# Patient Record
Sex: Female | Born: 1995 | Race: White | Hispanic: No | Marital: Single | Smoking: Never smoker
Health system: Southern US, Community
[De-identification: ages and names within clinical notes are randomized; demographics above are authoritative.]

## PROBLEM LIST (undated history)

## (undated) DIAGNOSIS — F909 Attention-deficit hyperactivity disorder, unspecified type: Secondary | ICD-10-CM

## (undated) HISTORY — DX: Attention-deficit hyperactivity disorder, unspecified type: F90.9

## (undated) HISTORY — PX: WISDOM TOOTH EXTRACTION: SHX21

---
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
## 2016-02-04 DIAGNOSIS — F329 Major depressive disorder, single episode, unspecified: Secondary | ICD-10-CM | POA: Diagnosis not present

## 2016-03-24 DIAGNOSIS — F329 Major depressive disorder, single episode, unspecified: Secondary | ICD-10-CM | POA: Diagnosis not present

## 2016-05-17 DIAGNOSIS — L568 Other specified acute skin changes due to ultraviolet radiation: Secondary | ICD-10-CM | POA: Diagnosis not present

## 2016-05-17 DIAGNOSIS — B078 Other viral warts: Secondary | ICD-10-CM | POA: Diagnosis not present

## 2016-05-17 DIAGNOSIS — D225 Melanocytic nevi of trunk: Secondary | ICD-10-CM | POA: Diagnosis not present

## 2016-05-17 DIAGNOSIS — L2084 Intrinsic (allergic) eczema: Secondary | ICD-10-CM | POA: Diagnosis not present

## 2016-05-24 DIAGNOSIS — F329 Major depressive disorder, single episode, unspecified: Secondary | ICD-10-CM | POA: Diagnosis not present

## 2016-08-04 DIAGNOSIS — Z6822 Body mass index (BMI) 22.0-22.9, adult: Secondary | ICD-10-CM | POA: Diagnosis not present

## 2016-08-04 DIAGNOSIS — F329 Major depressive disorder, single episode, unspecified: Secondary | ICD-10-CM | POA: Diagnosis not present

## 2016-08-04 DIAGNOSIS — Z01419 Encounter for gynecological examination (general) (routine) without abnormal findings: Secondary | ICD-10-CM | POA: Diagnosis not present

## 2016-10-03 DIAGNOSIS — F430 Acute stress reaction: Secondary | ICD-10-CM | POA: Diagnosis not present

## 2016-11-20 DIAGNOSIS — J029 Acute pharyngitis, unspecified: Secondary | ICD-10-CM | POA: Diagnosis not present

## 2016-11-29 DIAGNOSIS — J029 Acute pharyngitis, unspecified: Secondary | ICD-10-CM | POA: Diagnosis not present

## 2017-05-05 DIAGNOSIS — F329 Major depressive disorder, single episode, unspecified: Secondary | ICD-10-CM | POA: Diagnosis not present

## 2017-05-16 DIAGNOSIS — Z79899 Other long term (current) drug therapy: Secondary | ICD-10-CM | POA: Diagnosis not present

## 2017-05-24 DIAGNOSIS — B373 Candidiasis of vulva and vagina: Secondary | ICD-10-CM | POA: Diagnosis not present

## 2017-05-24 DIAGNOSIS — Z1159 Encounter for screening for other viral diseases: Secondary | ICD-10-CM | POA: Diagnosis not present

## 2017-05-24 DIAGNOSIS — Z114 Encounter for screening for human immunodeficiency virus [HIV]: Secondary | ICD-10-CM | POA: Diagnosis not present

## 2017-05-24 DIAGNOSIS — Z113 Encounter for screening for infections with a predominantly sexual mode of transmission: Secondary | ICD-10-CM | POA: Diagnosis not present

## 2017-05-24 DIAGNOSIS — Z118 Encounter for screening for other infectious and parasitic diseases: Secondary | ICD-10-CM | POA: Diagnosis not present

## 2017-06-04 DIAGNOSIS — F909 Attention-deficit hyperactivity disorder, unspecified type: Secondary | ICD-10-CM | POA: Diagnosis not present

## 2017-06-04 DIAGNOSIS — F329 Major depressive disorder, single episode, unspecified: Secondary | ICD-10-CM | POA: Diagnosis not present

## 2017-06-18 DIAGNOSIS — F909 Attention-deficit hyperactivity disorder, unspecified type: Secondary | ICD-10-CM | POA: Diagnosis not present

## 2017-06-18 DIAGNOSIS — F329 Major depressive disorder, single episode, unspecified: Secondary | ICD-10-CM | POA: Diagnosis not present

## 2017-07-20 DIAGNOSIS — F900 Attention-deficit hyperactivity disorder, predominantly inattentive type: Secondary | ICD-10-CM | POA: Diagnosis not present

## 2017-08-13 ENCOUNTER — Encounter: Payer: Self-pay | Admitting: Obstetrics & Gynecology

## 2017-08-13 ENCOUNTER — Ambulatory Visit (INDEPENDENT_AMBULATORY_CARE_PROVIDER_SITE_OTHER): Payer: BLUE CROSS/BLUE SHIELD | Admitting: Obstetrics & Gynecology

## 2017-08-13 VITALS — BP 112/70 | Ht 66.0 in | Wt 134.0 lb

## 2017-08-13 DIAGNOSIS — Z3041 Encounter for surveillance of contraceptive pills: Secondary | ICD-10-CM

## 2017-08-13 DIAGNOSIS — Z113 Encounter for screening for infections with a predominantly sexual mode of transmission: Secondary | ICD-10-CM

## 2017-08-13 DIAGNOSIS — Z01419 Encounter for gynecological examination (general) (routine) without abnormal findings: Secondary | ICD-10-CM

## 2017-08-13 MED ORDER — NORETHIN ACE-ETH ESTRAD-FE 1-20 MG-MCG PO TABS: 1.0000 | ORAL_TABLET | Freq: Every day | ORAL | 4 refills | Status: DC

## 2017-08-13 NOTE — Progress Notes (Signed)
Stephanie Hardin 09/05/1995 [id number]   History:    22 y.o. G0  Single.  [employer] in [employer].  Working in a store.  RP:  New patient presenting for annual gyn exam   HPI: Well on Junel Fe 1/20, good compliance, normal withdrawal menses.  No pelvic pain.  No pain with IC.  Not always using condoms.  Urine/BMs wnl.  Breasts wnl.  BMI 21.63.  Physically active, but not in any sport/Gym.  Maternal GM with Ovarian Serous Endometrioid CystadenoCa stage 1 at age 8961.  No other fam h/o Cancer.  Past medical history,surgical history, family history and social history were all reviewed and documented in the EPIC chart.  Gynecologic History No LMP recorded. (Menstrual status: Oral contraceptives). Contraception: OCP (estrogen/progesterone) Last Pap: Never.  Will start next year at [age] yo Last mammogram: Never Bone Density: N/A Colonoscopy: N/A  Obstetric History OB History  Gravida Para Term Preterm AB Living  0 0 0 0 0 0  SAB TAB Ectopic Multiple Live Births  0 0 0 0 0     ROS: A ROS was performed and pertinent positives and negatives are included in the history.  GENERAL: No fevers or chills. HEENT: No change in vision, no earache, sore throat or sinus congestion. NECK: No pain or stiffness. CARDIOVASCULAR: No chest pain or pressure. No palpitations. PULMONARY: No shortness of breath, cough or wheeze. GASTROINTESTINAL: No abdominal pain, nausea, vomiting or diarrhea, melena or bright red blood per rectum. GENITOURINARY: No urinary frequency, urgency, hesitancy or dysuria. MUSCULOSKELETAL: No joint or muscle pain, no back pain, no recent trauma. DERMATOLOGIC: No rash, no itching, no lesions. ENDOCRINE: No polyuria, polydipsia, no heat or cold intolerance. No recent change in weight. HEMATOLOGICAL: No anemia or easy bruising or bleeding. NEUROLOGIC: No headache, seizures, numbness, tingling or weakness. PSYCHIATRIC: No depression, no loss of interest in normal activity or change in sleep  pattern.     Exam:   BP 112/70   Ht 5\' 6"  (1.676 m)   Wt 134 lb (60.8 kg)   BMI 21.63 kg/m   Body mass index is 21.63 kg/m.  General appearance : Well developed well nourished female. No acute distress HEENT: Eyes: no retinal hemorrhage or exudates,  Neck supple, trachea midline, no carotid bruits, no thyroidmegaly Lungs: Clear to auscultation, no rhonchi or wheezes, or rib retractions  Heart: Regular rate and rhythm, no murmurs or gallops Breast:Examined in sitting and supine position were symmetrical in appearance, no palpable masses or tenderness,  no skin retraction, no nipple inversion, no nipple discharge, no skin discoloration, no axillary or supraclavicular lymphadenopathy Abdomen: no palpable masses or tenderness, no rebound or guarding Extremities: no edema or skin discoloration or tenderness  Pelvic: Vulva: Normal             Vagina: No gross lesions or discharge  Cervix: No gross lesions or discharge.  Gono-Chlam done  Uterus  AV, normal size, shape and consistency, non-tender and mobile  Adnexa  Without masses or tenderness  Anus: Normal   Assessment/Plan:  22 y.o. female for annual exam   1. Well female exam with routine gynecological exam Normal gynecologic exam.  Will start Pap test next year at 22.  Breast exam normal.  2. Encounter for surveillance of contraceptive pills Well on Junel FE 1/20.  No contraindication.  Good compliance.  Same birth control prescribed.  3. Screen for STD (sexually transmitted disease) Strict condom use recommended. - Gono-Chlam - HIV antibody (with reflex) -  RPR - Hepatitis B Surface AntiGEN - Hepatitis C Antibody  Other order - norethindrone-ethinyl estradiol (JUNEL FE,GILDESS FE,LOESTRIN FE) 1-20 MG-MCG tablet; Take 1 tablet by mouth daily.  Genia Del MD, 9:13 AM 08/13/2017

## 2017-08-14 LAB — C. TRACHOMATIS/N. GONORRHOEAE RNA
C. trachomatis RNA, TMA: NOT DETECTED
N. gonorrhoeae RNA, TMA: NOT DETECTED

## 2017-08-14 LAB — HEPATITIS C ANTIBODY
Hepatitis C Ab: NONREACTIVE
SIGNAL TO CUT-OFF: 0.01 (ref <1.00)

## 2017-08-14 LAB — HIV ANTIBODY (ROUTINE TESTING W REFLEX): HIV 1&2 Ab, 4th Generation: NONREACTIVE

## 2017-08-14 LAB — HEPATITIS B SURFACE ANTIGEN: Hepatitis B Surface Ag: NONREACTIVE

## 2017-08-14 LAB — RPR: RPR: NONREACTIVE

## 2017-08-17 ENCOUNTER — Encounter: Payer: Self-pay | Admitting: Obstetrics & Gynecology

## 2017-08-17 NOTE — Patient Instructions (Signed)
1. Well female exam with routine gynecological exam Normal gynecologic exam.  Will start Pap test next year at 22.  Breast exam normal.  2. Encounter for surveillance of contraceptive pills Well on Junel FE 1/20.  No contraindication.  Good compliance.  Same birth control prescribed.  3. Screen for STD (sexually transmitted disease) Strict condom use recommended. - Gono-Chlam - HIV antibody (with reflex) - RPR - Hepatitis B Surface AntiGEN - Hepatitis C Antibody  Other order - norethindrone-ethinyl estradiol (JUNEL FE,GILDESS FE,LOESTRIN FE) 1-20 MG-MCG tablet; Take 1 tablet by mouth daily.  Airika, it was a pleasure meeting you today!  I will inform you of your results as soon as they are available.  Health Maintenance, Female Adopting a healthy lifestyle and getting preventive care can go a long way to promote health and wellness. Talk with your health care provider about what schedule of regular examinations is right for you. This is a good chance for you to check in with your provider about disease prevention and staying healthy. In between checkups, there are plenty of things you can do on your own. Experts have done a lot of research about which lifestyle changes and preventive measures are most likely to keep you healthy. Ask your health care provider for more information. Weight and diet Eat a healthy diet  Be sure to include plenty of vegetables, fruits, low-fat dairy products, and lean protein.  Do not eat a lot of foods high in solid fats, added sugars, or salt.  Get regular exercise. This is one of the most important things you can do for your health. ? Most adults should exercise for at least 150 minutes each week. The exercise should increase your heart rate and make you sweat (moderate-intensity exercise). ? Most adults should also do strengthening exercises at least twice a week. This is in addition to the moderate-intensity exercise.  Maintain a healthy weight  Body  mass index (BMI) is a measurement that can be used to identify possible weight problems. It estimates body fat based on height and weight. Your health care provider can help determine your BMI and help you achieve or maintain a healthy weight.  For females 21 years of age and older: ? A BMI below 18.5 is considered underweight. ? A BMI of 18.5 to 24.9 is normal. ? A BMI of 25 to 29.9 is considered overweight. ? A BMI of 30 and above is considered obese.  Watch levels of cholesterol and blood lipids  You should start having your blood tested for lipids and cholesterol at 21 years of age, then have this test every 5 years.  You may need to have your cholesterol levels checked more often if: ? Your lipid or cholesterol levels are high. ? You are older than 21 years of age. ? You are at high risk for heart disease.  Cancer screening Lung Cancer  Lung cancer screening is recommended for adults 21-21 years old old who are at high risk for lung cancer because of a history of smoking.  A yearly low-dose CT scan of the lungs is recommended for people who: ? Currently smoke. ? Have quit within the past 15 years. ? Have at least a 30-pack-year history of smoking. A pack year is smoking an average of one pack of cigarettes a day for 1 year.  Yearly screening should continue until it has been 15 years since you quit.  Yearly screening should stop if you develop a health problem that would prevent you from  having lung cancer treatment.  Breast Cancer  Practice breast self-awareness. This means understanding how your breasts normally appear and feel.  It also means doing regular breast self-exams. Let your health care provider know about any changes, no matter how small.  If you are in your 20s or 30s, you should have a clinical breast exam (CBE) by a health care provider every 1-3 years as part of a regular health exam.  If you are 79 or older, have a CBE every year. Also consider having a  breast X-ray (mammogram) every year.  If you have a family history of breast cancer, talk to your health care provider about genetic screening.  If you are at high risk for breast cancer, talk to your health care provider about having an MRI and a mammogram every year.  Breast cancer gene (BRCA) assessment is recommended for women who have family members with BRCA-related cancers. BRCA-related cancers include: ? Breast. ? Ovarian. ? Tubal. ? Peritoneal cancers.  Results of the assessment will determine the need for genetic counseling and BRCA1 and BRCA2 testing.  Cervical Cancer Your health care provider may recommend that you be screened regularly for cancer of the pelvic organs (ovaries, uterus, and vagina). This screening involves a pelvic examination, including checking for microscopic changes to the surface of your cervix (Pap test). You may be encouraged to have this screening done every 3 years, beginning at age 66.  For women ages 31-65, health care providers may recommend pelvic exams and Pap testing every 3 years, or they may recommend the Pap and pelvic exam, combined with testing for human papilloma virus (HPV), every 5 years. Some types of HPV increase your risk of cervical cancer. Testing for HPV may also be done on women of any age with unclear Pap test results.  Other health care providers may not recommend any screening for nonpregnant women who are considered low risk for pelvic cancer and who do not have symptoms. Ask your health care provider if a screening pelvic exam is right for you.  If you have had past treatment for cervical cancer or a condition that could lead to cancer, you need Pap tests and screening for cancer for at least 20 years after your treatment. If Pap tests have been discontinued, your risk factors (such as having a new sexual partner) need to be reassessed to determine if screening should resume. Some women have medical problems that increase the chance  of getting cervical cancer. In these cases, your health care provider may recommend more frequent screening and Pap tests.  Colorectal Cancer  This type of cancer can be detected and often prevented.  Routine colorectal cancer screening usually begins at 21 years of age and continues through 21 years of age.  Your health care provider may recommend screening at an earlier age if you have risk factors for colon cancer.  Your health care provider may also recommend using home test kits to check for hidden blood in the stool.  A small camera at the end of a tube can be used to examine your colon directly (sigmoidoscopy or colonoscopy). This is done to check for the earliest forms of colorectal cancer.  Routine screening usually begins at age 78.  Direct examination of the colon should be repeated every 5-10 years through 21 years of age. However, you may need to be screened more often if early forms of precancerous polyps or small growths are found.  Skin Cancer  Check your skin from head  toe regularly.  Tell your health care provider about any new moles or changes in moles, especially if there is a change in a mole's shape or color.  Also tell your health care provider if you have a mole that is larger than the size of a pencil eraser.  Always use sunscreen. Apply sunscreen liberally and repeatedly throughout the day.  Protect yourself by wearing long sleeves, pants, a wide-brimmed hat, and sunglasses whenever you are outside.  Heart disease, diabetes, and high blood pressure  High blood pressure causes heart disease and increases the risk of stroke. High blood pressure is more likely to develop in: ? People who have blood pressure in the high end of the normal range (130-139/85-89 mm Hg). ? People who are overweight or obese. ? People who are African American.  If you are 18-39 years of age, have your blood pressure checked every 3-5 years. If you are 40 years of age or older,  have your blood pressure checked every year. You should have your blood pressure measured twice-once when you are at a hospital or clinic, and once when you are not at a hospital or clinic. Record the average of the two measurements. To check your blood pressure when you are not at a hospital or clinic, you can use: ? An automated blood pressure machine at a pharmacy. ? A home blood pressure monitor.  If you are between 55 years and 79 years old, ask your health care provider if you should take aspirin to prevent strokes.  Have regular diabetes screenings. This involves taking a blood sample to check your fasting blood sugar level. ? If you are at a normal weight and have a low risk for diabetes, have this test once every three years after 21 years of age. ? If you are overweight and have a high risk for diabetes, consider being tested at a younger age or more often. Preventing infection Hepatitis B  If you have a higher risk for hepatitis B, you should be screened for this virus. You are considered at high risk for hepatitis B if: ? You were born in a country where hepatitis B is common. Ask your health care provider which countries are considered high risk. ? Your parents were born in a high-risk country, and you have not been immunized against hepatitis B (hepatitis B vaccine). ? You have HIV or AIDS. ? You use needles to inject street drugs. ? You live with someone who has hepatitis B. ? You have had sex with someone who has hepatitis B. ? You get hemodialysis treatment. ? You take certain medicines for conditions, including cancer, organ transplantation, and autoimmune conditions.  Hepatitis C  Blood testing is recommended for: ? Everyone born from 1945 through 1965. ? Anyone with known risk factors for hepatitis C.  Sexually transmitted infections (STIs)  You should be screened for sexually transmitted infections (STIs) including gonorrhea and chlamydia if: ? You are sexually  active and are younger than 21 years of age. ? You are older than 21 years of age and your health care provider tells you that you are at risk for this type of infection. ? Your sexual activity has changed since you were last screened and you are at an increased risk for chlamydia or gonorrhea. Ask your health care provider if you are at risk.  If you do not have HIV, but are at risk, it may be recommended that you take a prescription medicine daily to prevent HIV infection.   infection. This is called pre-exposure prophylaxis (PrEP). You are considered at risk if: ? You are sexually active and do not regularly use condoms or know the HIV status of your partner(s). ? You take drugs by injection. ? You are sexually active with a partner who has HIV.  Talk with your health care provider about whether you are at high risk of being infected with HIV. If you choose to begin PrEP, you should first be tested for HIV. You should then be tested every 3 months for as long as you are taking PrEP. Pregnancy  If you are premenopausal and you may become pregnant, ask your health care provider about preconception counseling.  If you may become pregnant, take 400 to 800 micrograms (mcg) of folic acid every day.  If you want to prevent pregnancy, talk to your health care provider about birth control (contraception). Osteoporosis and menopause  Osteoporosis is a disease in which the bones lose minerals and strength with aging. This can result in serious bone fractures. Your risk for osteoporosis can be identified using a bone density scan.  If you are 56 years of age or older, or if you are at risk for osteoporosis and fractures, ask your health care provider if you should be screened.  Ask your health care provider whether you should take a calcium or vitamin D supplement to lower your risk for osteoporosis.  Menopause may have certain physical symptoms and risks.  Hormone replacement therapy may reduce some of these  symptoms and risks. Talk to your health care provider about whether hormone replacement therapy is right for you. Follow these instructions at home:  Schedule regular health, dental, and eye exams.  Stay current with your immunizations.  Do not use any tobacco products including cigarettes, chewing tobacco, or electronic cigarettes.  If you are pregnant, do not drink alcohol.  If you are breastfeeding, limit how much and how often you drink alcohol.  Limit alcohol intake to no more than 1 drink per day for nonpregnant women. One drink equals 12 ounces of beer, 5 ounces of wine, or 1 ounces of hard liquor.  Do not use street drugs.  Do not share needles.  Ask your health care provider for help if you need support or information about quitting drugs.  Tell your health care provider if you often feel depressed.  Tell your health care provider if you have ever been abused or do not feel safe at home. This information is not intended to replace advice given to you by your health care provider. Make sure you discuss any questions you have with your health care provider. Document Released: 11/21/2010 Document Revised: 10/14/2015 Document Reviewed: 02/09/2015 Elsevier Interactive Patient Education  [employer].

## 2017-09-05 DIAGNOSIS — F900 Attention-deficit hyperactivity disorder, predominantly inattentive type: Secondary | ICD-10-CM | POA: Diagnosis not present

## 2017-12-04 DIAGNOSIS — F900 Attention-deficit hyperactivity disorder, predominantly inattentive type: Secondary | ICD-10-CM | POA: Diagnosis not present

## 2017-12-24 ENCOUNTER — Telehealth: Payer: Self-pay | Admitting: *Deleted

## 2017-12-24 MED ORDER — NORETHIN ACE-ETH ESTRAD-FE 1-20 MG-MCG PO TABS: ORAL_TABLET | ORAL | 4 refills | Status: DC

## 2017-12-24 NOTE — Telephone Encounter (Signed)
Pharmacy called and said patient takes active pills daily skipping placebo pills needs # 84 Rx sent to pharmacy stating this, Rx re-sent to pharmacy with directions as note above. Patient mother aware this has been done.

## 2018-03-19 DIAGNOSIS — F329 Major depressive disorder, single episode, unspecified: Secondary | ICD-10-CM | POA: Diagnosis not present

## 2018-03-19 DIAGNOSIS — F900 Attention-deficit hyperactivity disorder, predominantly inattentive type: Secondary | ICD-10-CM | POA: Diagnosis not present

## 2018-05-27 DIAGNOSIS — J1089 Influenza due to other identified influenza virus with other manifestations: Secondary | ICD-10-CM | POA: Diagnosis not present

## 2018-08-24 ENCOUNTER — Other Ambulatory Visit: Payer: Self-pay | Admitting: Obstetrics & Gynecology

## 2018-09-16 DIAGNOSIS — F900 Attention-deficit hyperactivity disorder, predominantly inattentive type: Secondary | ICD-10-CM | POA: Diagnosis not present

## 2018-09-19 ENCOUNTER — Encounter: Payer: BLUE CROSS/BLUE SHIELD | Admitting: Obstetrics & Gynecology

## 2018-11-15 ENCOUNTER — Other Ambulatory Visit: Payer: Self-pay | Admitting: Obstetrics & Gynecology

## 2018-11-15 NOTE — Telephone Encounter (Signed)
Annual exam scheduled on 11/28/18

## 2018-11-28 ENCOUNTER — Encounter: Payer: BLUE CROSS/BLUE SHIELD | Admitting: Obstetrics & Gynecology

## 2018-12-03 ENCOUNTER — Ambulatory Visit (INDEPENDENT_AMBULATORY_CARE_PROVIDER_SITE_OTHER): Payer: BC Managed Care – PPO | Admitting: Obstetrics & Gynecology

## 2018-12-03 ENCOUNTER — Encounter: Payer: Self-pay | Admitting: Obstetrics & Gynecology

## 2018-12-03 ENCOUNTER — Other Ambulatory Visit: Payer: Self-pay

## 2018-12-03 VITALS — BP 110/70 | Ht 66.0 in | Wt 129.0 lb

## 2018-12-03 DIAGNOSIS — Z113 Encounter for screening for infections with a predominantly sexual mode of transmission: Secondary | ICD-10-CM | POA: Diagnosis not present

## 2018-12-03 DIAGNOSIS — Z01419 Encounter for gynecological examination (general) (routine) without abnormal findings: Secondary | ICD-10-CM

## 2018-12-03 DIAGNOSIS — Z3041 Encounter for surveillance of contraceptive pills: Secondary | ICD-10-CM

## 2018-12-03 MED ORDER — NORETHIN ACE-ETH ESTRAD-FE 1-20 MG-MCG PO TABS: ORAL_TABLET | ORAL | 4 refills | Status: DC

## 2018-12-03 NOTE — Progress Notes (Signed)
Stephanie Hardin March 13, 1996 [id number]   History:    23 y.o. G0 Single. New boyfriend x last year.  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  RP:  Established patient presenting for annual gyn exam   HPI: Well on Microgestin 1/20 Fe continuous use.  No BTB.  No pelvic pain.  No pain with IC.  Not using condoms.  Urine/BMs normal.  Breasts normal.  BMI 20.82.  Walking occasionally.  Past medical history,surgical history, family history and social history were all reviewed and documented in the EPIC chart.  Gynecologic History No LMP recorded. (Menstrual status: Oral contraceptives). Contraception: OCP (estrogen/progesterone) Last Pap: Never.  Gardasil received. Last mammogram: Never Bone Density: Never Colonoscopy: Never  Obstetric History OB History  Gravida Para Term Preterm AB Living  0 0 0 0 0 0  SAB TAB Ectopic Multiple Live Births  0 0 0 0 0     ROS: A ROS was performed and pertinent positives and negatives are included in the history.  GENERAL: No fevers or chills. HEENT: No change in vision, no earache, sore throat or sinus congestion. NECK: No pain or stiffness. CARDIOVASCULAR: No chest pain or pressure. No palpitations. PULMONARY: No shortness of breath, cough or wheeze. GASTROINTESTINAL: No abdominal pain, nausea, vomiting or diarrhea, melena or bright red blood per rectum. GENITOURINARY: No urinary frequency, urgency, hesitancy or dysuria. MUSCULOSKELETAL: No joint or muscle pain, no back pain, no recent trauma. DERMATOLOGIC: No rash, no itching, no lesions. ENDOCRINE: No polyuria, polydipsia, no heat or cold intolerance. No recent change in weight. HEMATOLOGICAL: No anemia or easy bruising or bleeding. NEUROLOGIC: No headache, seizures, numbness, tingling or weakness. PSYCHIATRIC: No depression, no loss of interest in normal activity or change in sleep pattern.     Exam:   BP 110/70   Ht 5\' 6"  (1.676 m)   Wt 129 lb (58.5 kg)   BMI 20.82 kg/m   Body mass index is  20.82 kg/m.  General appearance : Well developed well nourished female. No acute distress HEENT: Eyes: no retinal hemorrhage or exudates,  Neck supple, trachea midline, no carotid bruits, no thyroidmegaly Lungs: Clear to auscultation, no rhonchi or wheezes, or rib retractions  Heart: Regular rate and rhythm, no murmurs or gallops Breast:Examined in sitting and supine position were symmetrical in appearance, no palpable masses or tenderness,  no skin retraction, no nipple inversion, no nipple discharge, no skin discoloration, no axillary or supraclavicular lymphadenopathy Abdomen: no palpable masses or tenderness, no rebound or guarding Extremities: no edema or skin discoloration or tenderness  Pelvic: Vulva: Normal             Vagina: No gross lesions or discharge  Cervix: No gross lesions or discharge.  Pap reflex, Gono-Chlam done.  Uterus  AV, normal size, shape and consistency, non-tender and mobile  Adnexa  Without masses or tenderness  Anus: Normal   Assessment/Plan:  23 y.o. female for annual exam   1. Encounter for routine gynecological examination with Papanicolaou smear of cervix Normal gynecologic exam.  Pap reflex done.  Gardasil immunization received in the past.  Breast exam normal.  BMI 20.82.  Recommend aerobic activities 5 times a week and weight lifting every 2 days.    2. Encounter for surveillance of contraceptive pills Well on Microgestin Fe !/20 continuous use.  No contraindication to continue.  Prescription sent to pharmacy.  3. Screen for STD (sexually transmitted disease) Strict condom use recommended. - Gono-Chlam on Pap - HIV antibody (with reflex) -  RPR - Hepatitis C Antibody - Hepatitis B Surface AntiGEN  Other orders - norethindrone-ethinyl estradiol (MICROGESTIN FE 1/20) 1-20 MG-MCG tablet; TAKE 1 TABLET BY MOUTH EVERY DAY SKIPPING PLACEBO PILLS  Genia DelMarie-Lyne Donnella Morford MD, 11:33 AM 12/03/2018

## 2018-12-03 NOTE — Addendum Note (Signed)
Addended by: Thurnell Garbe A on: 12/03/2018 12:08 PM   Modules accepted: Orders

## 2018-12-03 NOTE — Patient Instructions (Signed)
1. Encounter for routine gynecological examination with Papanicolaou smear of cervix Normal gynecologic exam.  Pap reflex done.  Gardasil immunization received in the past.  Breast exam normal.  BMI 20.82.  Recommend aerobic activities 5 times a week and weight lifting every 2 days.    2. Encounter for surveillance of contraceptive pills Well on Microgestin Fe !/20 continuous use.  No contraindication to continue.  Prescription sent to pharmacy.  3. Screen for STD (sexually transmitted disease) Strict condom use recommended. - Gono-Chlam on Pap - HIV antibody (with reflex) - RPR - Hepatitis C Antibody - Hepatitis B Surface AntiGEN  Other orders - norethindrone-ethinyl estradiol (MICROGESTIN FE 1/20) 1-20 MG-MCG tablet; TAKE 1 TABLET BY MOUTH EVERY DAY SKIPPING PLACEBO PILLS  Stephanie Hardin, it was a pleasure seeing you today!  I will inform you of your results as soon as they are available.

## 2018-12-04 LAB — HEPATITIS C ANTIBODY
Hepatitis C Ab: NONREACTIVE
SIGNAL TO CUT-OFF: 0.01 (ref <1.00)

## 2018-12-04 LAB — RPR: RPR Ser Ql: NONREACTIVE

## 2018-12-04 LAB — HIV ANTIBODY (ROUTINE TESTING W REFLEX): HIV 1&2 Ab, 4th Generation: NONREACTIVE

## 2018-12-04 LAB — HEPATITIS B SURFACE ANTIGEN: Hepatitis B Surface Ag: NONREACTIVE

## 2018-12-05 LAB — PAP THINPREP ASCUS RFLX HPV RFLX TYPE
C. trachomatis RNA, TMA: NOT DETECTED
N. gonorrhoeae RNA, TMA: NOT DETECTED

## 2019-01-17 DIAGNOSIS — Z20828 Contact with and (suspected) exposure to other viral communicable diseases: Secondary | ICD-10-CM | POA: Diagnosis not present

## 2019-02-10 ENCOUNTER — Other Ambulatory Visit: Payer: Self-pay | Admitting: Obstetrics & Gynecology

## 2019-03-13 DIAGNOSIS — F900 Attention-deficit hyperactivity disorder, predominantly inattentive type: Secondary | ICD-10-CM | POA: Diagnosis not present

## 2019-04-11 ENCOUNTER — Other Ambulatory Visit: Payer: Self-pay | Admitting: Obstetrics & Gynecology

## 2019-06-05 DIAGNOSIS — Z0183 Encounter for blood typing: Secondary | ICD-10-CM | POA: Diagnosis not present

## 2019-06-05 DIAGNOSIS — Z0001 Encounter for general adult medical examination with abnormal findings: Secondary | ICD-10-CM | POA: Diagnosis not present

## 2019-06-05 DIAGNOSIS — E559 Vitamin D deficiency, unspecified: Secondary | ICD-10-CM | POA: Diagnosis not present

## 2019-09-09 DIAGNOSIS — F329 Major depressive disorder, single episode, unspecified: Secondary | ICD-10-CM | POA: Diagnosis not present

## 2019-09-09 DIAGNOSIS — F900 Attention-deficit hyperactivity disorder, predominantly inattentive type: Secondary | ICD-10-CM | POA: Diagnosis not present

## 2019-09-26 ENCOUNTER — Other Ambulatory Visit: Payer: Self-pay | Admitting: Obstetrics & Gynecology

## 2020-03-08 DIAGNOSIS — F900 Attention-deficit hyperactivity disorder, predominantly inattentive type: Secondary | ICD-10-CM | POA: Diagnosis not present

## 2020-03-19 ENCOUNTER — Other Ambulatory Visit: Payer: Self-pay | Admitting: Obstetrics & Gynecology

## 2020-03-29 ENCOUNTER — Telehealth: Payer: Self-pay | Admitting: *Deleted

## 2020-03-29 MED ORDER — NORETHIN ACE-ETH ESTRAD-FE 1-20 MG-MCG PO TABS: ORAL_TABLET | ORAL | 0 refills | Status: DC

## 2020-03-29 NOTE — Telephone Encounter (Signed)
Patient has annual exam scheduled on 06/02/20 needs refill on birth control pills. Rx sent.

## 2020-06-02 ENCOUNTER — Encounter: Payer: Self-pay | Admitting: Obstetrics & Gynecology

## 2020-06-02 ENCOUNTER — Other Ambulatory Visit: Payer: Self-pay

## 2020-06-02 ENCOUNTER — Ambulatory Visit (INDEPENDENT_AMBULATORY_CARE_PROVIDER_SITE_OTHER): Payer: BC Managed Care – PPO | Admitting: Obstetrics & Gynecology

## 2020-06-02 VITALS — BP 120/80 | Ht 66.0 in | Wt 127.6 lb

## 2020-06-02 DIAGNOSIS — Z3041 Encounter for surveillance of contraceptive pills: Secondary | ICD-10-CM | POA: Diagnosis not present

## 2020-06-02 DIAGNOSIS — Z113 Encounter for screening for infections with a predominantly sexual mode of transmission: Secondary | ICD-10-CM

## 2020-06-02 DIAGNOSIS — Z01419 Encounter for gynecological examination (general) (routine) without abnormal findings: Secondary | ICD-10-CM

## 2020-06-02 MED ORDER — NORETHIN ACE-ETH ESTRAD-FE 1-20 MG-MCG PO TABS: ORAL_TABLET | ORAL | 4 refills | Status: DC

## 2020-06-02 NOTE — Addendum Note (Signed)
Addended by: Berna Spare A on: 06/02/2020 01:26 PM   Modules accepted: Orders

## 2020-06-02 NOTE — Progress Notes (Signed)
Korinna Tat 11/22/1995 [id number]   History:    25 y.o. G0 Single. Boyfriend x 2 years.  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  RP:  Established patient presenting for annual gyn exam   HPI: Well on Microgestin 1/20 Fe continuous use.  No BTB.  No pelvic pain.  No pain with IC.  Not using condoms.  Urine/BMs normal.  Breasts normal.  BMI 20.6.  Walking occasionally.   Past medical history,surgical history, family history and social history were all reviewed and documented in the EPIC chart.  Gynecologic History No LMP recorded. (Menstrual status: Oral contraceptives).   Obstetric History OB History  Gravida Para Term Preterm AB Living  0 0 0 0 0 0  SAB IAB Ectopic Multiple Live Births  0 0 0 0 0     ROS: A ROS was performed and pertinent positives and negatives are included in the history.  GENERAL: No fevers or chills. HEENT: No change in vision, no earache, sore throat or sinus congestion. NECK: No pain or stiffness. CARDIOVASCULAR: No chest pain or pressure. No palpitations. PULMONARY: No shortness of breath, cough or wheeze. GASTROINTESTINAL: No abdominal pain, nausea, vomiting or diarrhea, melena or bright red blood per rectum. GENITOURINARY: No urinary frequency, urgency, hesitancy or dysuria. MUSCULOSKELETAL: No joint or muscle pain, no back pain, no recent trauma. DERMATOLOGIC: No rash, no itching, no lesions. ENDOCRINE: No polyuria, polydipsia, no heat or cold intolerance. No recent change in weight. HEMATOLOGICAL: No anemia or easy bruising or bleeding. NEUROLOGIC: No headache, seizures, numbness, tingling or weakness. PSYCHIATRIC: No depression, no loss of interest in normal activity or change in sleep pattern.     Exam:   BP 120/80   Ht 5\' 6"  (1.676 m)   Wt 127 lb 9.6 oz (57.9 kg)   BMI 20.60 kg/m   Body mass index is 20.6 kg/m.  General appearance : Well developed well nourished female. No acute distress HEENT: Eyes: no retinal hemorrhage or  exudates,  Neck supple, trachea midline, no carotid bruits, no thyroidmegaly Lungs: Clear to auscultation, no rhonchi or wheezes, or rib retractions  Heart: Regular rate and rhythm, no murmurs or gallops Breast:Examined in sitting and supine position were symmetrical in appearance, no palpable masses or tenderness,  no skin retraction, no nipple inversion, no nipple discharge, no skin discoloration, no axillary or supraclavicular lymphadenopathy Abdomen: no palpable masses or tenderness, no rebound or guarding Extremities: no edema or skin discoloration or tenderness  Pelvic: Vulva: Normal             Vagina: No gross lesions or discharge  Cervix: No gross lesions or discharge.  Pap reflex/Gono-Chlam done  Uterus  AV, normal size, shape and consistency, non-tender and mobile  Adnexa  Without masses or tenderness  Anus: Normal   Assessment/Plan:  25 y.o. female for annual exam   1. Encounter for routine gynecological examination with Papanicolaou smear of cervix Normal gynecologic exam.  Pap reflex done.  Breast exam normal.  Good BMI at 20.6.  Continue with healthy nutrition.  Recommend aerobic activities 5 times a week and light weight lifting every 2 days.  2. Encounter for surveillance of contraceptive pills Well on Junel Fe 1/20 continuous use.  No CI to continue.  Prescription sent to pharmacy.  3. Screen for STD (sexually transmitted disease) - Gono-Chlam on Pap - HIV antibody (with reflex) - RPR - Hepatitis B Surface AntiGEN - Hepatitis C Antibody  Other orders - hydrOXYzine (ATARAX/VISTARIL) 10 MG  tablet; Take 10 mg by mouth 3 (three) times daily as needed. - norethindrone-ethinyl estradiol (JUNEL FE 1/20) 1-20 MG-MCG tablet; TAKE 1 TABLET BY MOUTH EVERY DAY SKIPPING PLACEBO PILLS  Genia Del MD, 12:00 PM 06/02/2020

## 2020-06-03 LAB — PAP THINPREP ASCUS RFLX HPV RFLX TYPE
C. trachomatis RNA, TMA: NOT DETECTED
N. gonorrhoeae RNA, TMA: NOT DETECTED

## 2020-06-03 LAB — HEPATITIS B SURFACE ANTIGEN: Hepatitis B Surface Ag: NONREACTIVE

## 2020-06-03 LAB — HEPATITIS C ANTIBODY
Hepatitis C Ab: NONREACTIVE
SIGNAL TO CUT-OFF: 0.01 (ref <1.00)

## 2020-06-03 LAB — RPR: RPR Ser Ql: NONREACTIVE

## 2020-06-03 LAB — HIV ANTIBODY (ROUTINE TESTING W REFLEX): HIV 1&2 Ab, 4th Generation: NONREACTIVE

## 2021-06-03 ENCOUNTER — Ambulatory Visit: Payer: BC Managed Care – PPO | Admitting: Obstetrics & Gynecology

## 2021-07-24 ENCOUNTER — Other Ambulatory Visit: Payer: Self-pay | Admitting: Obstetrics & Gynecology

## 2021-07-25 NOTE — Telephone Encounter (Signed)
Med refill request: Junel Fe 1/20, take 1 tab PO daily, skipping placebo pills ?Last AEX: 06/02/20, ML ?Next AEX: 08/17/21 ?Last MMG (if hormonal med) N/A ? ?Rx last filled 06/02/20, #112, 4RF ? ?Dr. Seymour Bars -please advise on refill. Rx pended for 90 day supply.  ? ?

## 2021-08-17 ENCOUNTER — Ambulatory Visit: Payer: BC Managed Care – PPO | Admitting: Obstetrics & Gynecology

## 2021-10-16 ENCOUNTER — Other Ambulatory Visit: Payer: Self-pay | Admitting: Obstetrics & Gynecology
